# Patient Record
Sex: Female | Born: 1990
Health system: Southern US, Community
[De-identification: ages and names within clinical notes are randomized; demographics above are authoritative.]

---

## 2010-06-30 ENCOUNTER — Emergency Department (HOSPITAL_COMMUNITY): Admission: EM | Admit: 2010-06-30 | Discharge: 2010-07-01 | Payer: Self-pay | Admitting: Emergency Medicine

## 2011-02-10 LAB — DIFFERENTIAL
Basophils Absolute: 0 10*3/uL (ref 0.0–0.1)
Eosinophils Relative: 1 % (ref 0–5)
Lymphocytes Relative: 44 % (ref 12–46)
Lymphs Abs: 2.8 10*3/uL (ref 0.7–4.0)
Monocytes Absolute: 0.4 10*3/uL (ref 0.1–1.0)

## 2011-02-10 LAB — BASIC METABOLIC PANEL
BUN: 12 mg/dL (ref 6–23)
CO2: 23 mEq/L (ref 19–32)
Chloride: 104 mEq/L (ref 96–112)
Potassium: 3.9 mEq/L (ref 3.5–5.1)

## 2011-02-10 LAB — RAPID URINE DRUG SCREEN, HOSP PERFORMED
Amphetamines: NOT DETECTED
Benzodiazepines: POSITIVE — AB

## 2011-02-10 LAB — CBC
HCT: 37 % (ref 36.0–46.0)
MCV: 88.3 fL (ref 78.0–100.0)
RDW: 14.1 % (ref 11.5–15.5)
WBC: 6.5 10*3/uL (ref 4.0–10.5)

## 2011-02-10 LAB — PREGNANCY, URINE: Preg Test, Ur: NEGATIVE

## 2011-02-10 LAB — ETHANOL: Alcohol, Ethyl (B): <5 mg/dL (ref 0–10)

## 2014-05-26 DIAGNOSIS — G43909 Migraine, unspecified, not intractable, without status migrainosus: Secondary | ICD-10-CM | POA: Insufficient documentation

## 2014-05-26 DIAGNOSIS — R41840 Attention and concentration deficit: Secondary | ICD-10-CM | POA: Insufficient documentation

## 2014-08-13 ENCOUNTER — Emergency Department (HOSPITAL_COMMUNITY)
Admission: EM | Admit: 2014-08-13 | Discharge: 2014-08-14 | Disposition: A | Discharge status: Home / Self Care | Payer: 59 | Attending: Emergency Medicine | Admitting: Emergency Medicine

## 2014-08-13 ENCOUNTER — Encounter (HOSPITAL_COMMUNITY): Payer: Self-pay | Admitting: Emergency Medicine

## 2014-08-13 ENCOUNTER — Emergency Department (HOSPITAL_COMMUNITY): Payer: 59

## 2014-08-13 DIAGNOSIS — Y9389 Activity, other specified: Secondary | ICD-10-CM | POA: Insufficient documentation

## 2014-08-13 DIAGNOSIS — Z23 Encounter for immunization: Secondary | ICD-10-CM | POA: Diagnosis not present

## 2014-08-13 DIAGNOSIS — S61209A Unspecified open wound of unspecified finger without damage to nail, initial encounter: Secondary | ICD-10-CM | POA: Insufficient documentation

## 2014-08-13 DIAGNOSIS — Y9289 Other specified places as the place of occurrence of the external cause: Secondary | ICD-10-CM | POA: Insufficient documentation

## 2014-08-13 DIAGNOSIS — IMO0001 Reserved for inherently not codable concepts without codable children: Secondary | ICD-10-CM | POA: Diagnosis not present

## 2014-08-13 DIAGNOSIS — W5501XA Bitten by cat, initial encounter: Secondary | ICD-10-CM

## 2014-08-13 MED ORDER — AMOXICILLIN-POT CLAVULANATE 875-125 MG PO TABS: 1.0000 | ORAL_TABLET | Freq: Once | ORAL | Status: DC

## 2014-08-13 MED ORDER — AMOXICILLIN-POT CLAVULANATE 875-125 MG PO TABS
1.0000 | ORAL_TABLET | Freq: Once | ORAL | Status: AC
  Administered 2014-08-14: 1 via ORAL
  Filled 2014-08-13: qty 1

## 2014-08-13 MED ORDER — ONDANSETRON 4 MG PO TBDP
4.0000 mg | ORAL_TABLET | Freq: Once | ORAL | Status: AC
  Administered 2014-08-14: 4 mg via ORAL
  Filled 2014-08-13: qty 1

## 2014-08-13 NOTE — ED Provider Notes (Signed)
CSN: [id number]     Arrival date & time 08/13/14  2059 History  This chart was scribed for non-physician practitioner working with Hurman Horn, MD, by Roxy Cedar ED Scribe. This patient was seen in room WTR9/WTR9 and the patient's care was started at 11:36 PM   Chief Complaint  Patient presents with  . Animal Bite   The history is provided by the patient. No language interpreter was used.   HPI Comments: Sharon Ewing is a 23 y.o. female who presents to the Emergency Department complaining of an animal bite that occurred earlier today 3.5 hours ago when patient was bit by her pet cat near the thumb of her right hand. Patient states that the cat is up to date on immunizations. Patient states that she has felt constant pain in her right thumb since onset. She states that she felt an intial shooting pain and numbness when her cat bit her but does not feel that anymore. Patient is able to move her thumb. Patient is unsure if she is up to date with her tetanus shot. Patient states that she was in the process of moving out of town and was leaving tonight when cat bite incident occurred.  History reviewed. No pertinent past medical history. History reviewed. No pertinent past surgical history. Family History  Problem Relation Age of Onset  . Migraines Other   . Diabetes Other    History  Substance Use Topics  . Smoking status: Never Smoker   . Smokeless tobacco: Not on file  . Alcohol Use: Yes     Comment: social   OB History   Grav Para Term Preterm Abortions TAB SAB Ect Mult Living                 Review of Systems  Constitutional: Negative for fever and chills.  Gastrointestinal: Negative for vomiting.  Skin: Positive for wound (cat bite).  All other systems reviewed and are negative.  Allergies  Prednisone  Home Medications   Prior to Admission medications   Medication Sig Start Date End Date Taking? Authorizing Provider  amoxicillin-clavulanate (AUGMENTIN) 500-125 MG  per tablet Take 1 tablet (500 mg total) by mouth every 8 (eight) hours. 08/14/14   Chakira Jachim Irine Seal, PA-C  HYDROcodone-acetaminophen (NORCO/VICODIN) 5-325 MG per tablet Take 1-2 tablets by mouth every 4 (four) hours as needed for moderate pain or severe pain. 08/14/14   Dorthula Matas, PA-C   Triage Vitals: BP 114/93  Pulse 74  Temp(Src) 98.3 F (36.8 C) (Oral)  Resp 20  SpO2 100%  LMP 08/11/2014  Physical Exam  Nursing note and vitals reviewed. Constitutional: She appears well-developed and well-nourished.  HENT:  Head: Normocephalic and atraumatic.  Eyes: Conjunctivae are normal. Right eye exhibits no discharge. Left eye exhibits no discharge.  Pulmonary/Chest: Effort normal. No respiratory distress.  Neurological: She is alert. Coordination normal.  Skin: Skin is warm and dry. No rash noted. She is not diaphoretic. No erythema.  Tenderness to right thumb, mild pain with passive ROM. No associated cellulitis. Small amount of ecchymosis. Two puncture wound anterior and one posterior to thenar eminence  Psychiatric: She has a normal mood and affect.   ED Course  Procedures (including critical care time)  DIAGNOSTIC STUDIES: Oxygen Saturation is 100% on RA, normal by my interpretation.    COORDINATION OF CARE: 11:39 PM- Will give patient tetanus shot today. Discussed normal xray results with patient. Explained that cat may have bit into tendon, which is causing increased  amounts of pain to patient. Discussed plans to give patient antibiotics. Advised patient to come back in for recheck if injury becomes more erythematous or firm. Pt advised of plan for treatment and pt agrees.  Labs Review Labs Reviewed - No data to display  Imaging Review Dg Hand Complete Right  08/13/2014   CLINICAL DATA:  Right hand pain following cat bite.  EXAM: RIGHT HAND - COMPLETE 3+ VIEW  COMPARISON:  None.  FINDINGS: There is no evidence of fracture or dislocation. There is no evidence of arthropathy or  other focal bone abnormality. Soft tissues are unremarkable.  No radiopaque foreign bodies are identified.  IMPRESSION: Negative.   Electronically Signed   By: Laveda Abbe M.D.   On: 08/13/2014 22:32    EKG Interpretation None     MDM   Final diagnoses:  Cat bite, initial encounter    Wound cleaned, covered in non stick gauze and wrapped in coban. Tetanus shot and Augmentin given in ED. I was VERY clear that the risk of infection is high regardless of pt having abx, she is on her way to [place] where she is moving. She has been made aware that she needs to be seen right away for red flag symptoms.  amoxicillin-clavulanate (AUGMENTIN) 500-125 MG per tablet Take 1 tablet (500 mg total) by mouth every 8 (eight) hours. 21 tablet Dorthula Matas, PA-C   HYDROcodone-acetaminophen (NORCO/VICODIN) 5-325 MG per tablet Take 1-2 tablets by mouth every 4 (four) hours as needed for moderate pain or severe pain. 12 tablet Dorthula Matas, PA-C   23 y.o.[name] evaluation in the Emergency Department is complete. It has been determined that no acute conditions requiring further emergency intervention are present at this time. The patient/guardian have been advised of the diagnosis and plan. We have discussed signs and symptoms that warrant return to the ED, such as changes or worsening in symptoms.  Vital signs are stable at discharge. Filed Vitals:   08/14/14 0005  BP: 125/58  Pulse: 79  Temp: 98 F (36.7 C)  Resp: 16    Patient/guardian has voiced understanding and agreed to follow-up with the PCP or specialist.   I personally performed the services described in this documentation, which was scribed in my presence. The recorded information has been reviewed and is accurate.  Dorthula Matas, PA-C 08/14/14 0008

## 2014-08-13 NOTE — ED Notes (Signed)
Pt states she was bit by her cat on the right hand   Pt has puncture marks on her anterior and posterior aspect of her thumb  Pt is c/o pain to right hand

## 2014-08-14 MED ORDER — AMOXICILLIN-POT CLAVULANATE 500-125 MG PO TABS: 1.0000 | ORAL_TABLET | Freq: Three times a day (TID) | ORAL | Status: DC

## 2014-08-14 MED ORDER — HYDROCODONE-ACETAMINOPHEN 5-325 MG PO TABS: 1.0000 | ORAL_TABLET | ORAL | Status: DC | PRN

## 2014-08-14 NOTE — Discharge Instructions (Signed)

## 2014-08-14 NOTE — ED Provider Notes (Signed)
Medical screening examination/treatment/procedure(s) were performed by non-physician practitioner and as supervising physician I was immediately available for consultation/collaboration.   EKG Interpretation None       Derwood Kaplan, MD 08/14/14 [phone]

## 2019-12-18 DIAGNOSIS — T781XXD Other adverse food reactions, not elsewhere classified, subsequent encounter: Secondary | ICD-10-CM | POA: Diagnosis not present

## 2019-12-18 DIAGNOSIS — J301 Allergic rhinitis due to pollen: Secondary | ICD-10-CM | POA: Diagnosis not present

## 2019-12-18 DIAGNOSIS — R21 Rash and other nonspecific skin eruption: Secondary | ICD-10-CM | POA: Diagnosis not present

## 2019-12-24 DIAGNOSIS — J3081 Allergic rhinitis due to animal (cat) (dog) hair and dander: Secondary | ICD-10-CM | POA: Diagnosis not present

## 2019-12-24 DIAGNOSIS — J301 Allergic rhinitis due to pollen: Secondary | ICD-10-CM | POA: Diagnosis not present

## 2020-01-01 DIAGNOSIS — J301 Allergic rhinitis due to pollen: Secondary | ICD-10-CM | POA: Diagnosis not present

## 2020-01-01 DIAGNOSIS — J3081 Allergic rhinitis due to animal (cat) (dog) hair and dander: Secondary | ICD-10-CM | POA: Diagnosis not present

## 2020-01-05 DIAGNOSIS — J301 Allergic rhinitis due to pollen: Secondary | ICD-10-CM | POA: Diagnosis not present

## 2020-01-05 DIAGNOSIS — J3081 Allergic rhinitis due to animal (cat) (dog) hair and dander: Secondary | ICD-10-CM | POA: Diagnosis not present

## 2020-01-07 DIAGNOSIS — J3081 Allergic rhinitis due to animal (cat) (dog) hair and dander: Secondary | ICD-10-CM | POA: Diagnosis not present

## 2020-01-07 DIAGNOSIS — J301 Allergic rhinitis due to pollen: Secondary | ICD-10-CM | POA: Diagnosis not present

## 2020-01-21 DIAGNOSIS — J3081 Allergic rhinitis due to animal (cat) (dog) hair and dander: Secondary | ICD-10-CM | POA: Diagnosis not present

## 2020-01-21 DIAGNOSIS — J301 Allergic rhinitis due to pollen: Secondary | ICD-10-CM | POA: Diagnosis not present

## 2020-01-23 DIAGNOSIS — J3081 Allergic rhinitis due to animal (cat) (dog) hair and dander: Secondary | ICD-10-CM | POA: Diagnosis not present

## 2020-01-23 DIAGNOSIS — J301 Allergic rhinitis due to pollen: Secondary | ICD-10-CM | POA: Diagnosis not present

## 2020-01-26 DIAGNOSIS — J3081 Allergic rhinitis due to animal (cat) (dog) hair and dander: Secondary | ICD-10-CM | POA: Diagnosis not present

## 2020-01-26 DIAGNOSIS — J301 Allergic rhinitis due to pollen: Secondary | ICD-10-CM | POA: Diagnosis not present

## 2020-01-30 DIAGNOSIS — J3081 Allergic rhinitis due to animal (cat) (dog) hair and dander: Secondary | ICD-10-CM | POA: Diagnosis not present

## 2020-01-30 DIAGNOSIS — J301 Allergic rhinitis due to pollen: Secondary | ICD-10-CM | POA: Diagnosis not present

## 2020-02-04 DIAGNOSIS — J301 Allergic rhinitis due to pollen: Secondary | ICD-10-CM | POA: Diagnosis not present

## 2020-02-04 DIAGNOSIS — J3081 Allergic rhinitis due to animal (cat) (dog) hair and dander: Secondary | ICD-10-CM | POA: Diagnosis not present

## 2020-02-09 DIAGNOSIS — J3081 Allergic rhinitis due to animal (cat) (dog) hair and dander: Secondary | ICD-10-CM | POA: Diagnosis not present

## 2020-02-09 DIAGNOSIS — J301 Allergic rhinitis due to pollen: Secondary | ICD-10-CM | POA: Diagnosis not present

## 2020-02-12 DIAGNOSIS — J301 Allergic rhinitis due to pollen: Secondary | ICD-10-CM | POA: Diagnosis not present

## 2020-02-12 DIAGNOSIS — J3081 Allergic rhinitis due to animal (cat) (dog) hair and dander: Secondary | ICD-10-CM | POA: Diagnosis not present

## 2020-02-16 DIAGNOSIS — J3089 Other allergic rhinitis: Secondary | ICD-10-CM | POA: Diagnosis not present

## 2020-02-16 DIAGNOSIS — J301 Allergic rhinitis due to pollen: Secondary | ICD-10-CM | POA: Diagnosis not present

## 2020-02-20 DIAGNOSIS — J3081 Allergic rhinitis due to animal (cat) (dog) hair and dander: Secondary | ICD-10-CM | POA: Diagnosis not present

## 2020-02-20 DIAGNOSIS — J301 Allergic rhinitis due to pollen: Secondary | ICD-10-CM | POA: Diagnosis not present

## 2020-02-25 DIAGNOSIS — J3081 Allergic rhinitis due to animal (cat) (dog) hair and dander: Secondary | ICD-10-CM | POA: Diagnosis not present

## 2020-02-25 DIAGNOSIS — J301 Allergic rhinitis due to pollen: Secondary | ICD-10-CM | POA: Diagnosis not present

## 2020-03-03 DIAGNOSIS — J3081 Allergic rhinitis due to animal (cat) (dog) hair and dander: Secondary | ICD-10-CM | POA: Diagnosis not present

## 2020-03-03 DIAGNOSIS — J301 Allergic rhinitis due to pollen: Secondary | ICD-10-CM | POA: Diagnosis not present

## 2020-03-05 DIAGNOSIS — J3081 Allergic rhinitis due to animal (cat) (dog) hair and dander: Secondary | ICD-10-CM | POA: Diagnosis not present

## 2020-03-05 DIAGNOSIS — J301 Allergic rhinitis due to pollen: Secondary | ICD-10-CM | POA: Diagnosis not present

## 2020-03-10 DIAGNOSIS — J301 Allergic rhinitis due to pollen: Secondary | ICD-10-CM | POA: Diagnosis not present

## 2020-03-10 DIAGNOSIS — J3081 Allergic rhinitis due to animal (cat) (dog) hair and dander: Secondary | ICD-10-CM | POA: Diagnosis not present

## 2020-03-17 DIAGNOSIS — J301 Allergic rhinitis due to pollen: Secondary | ICD-10-CM | POA: Diagnosis not present

## 2020-03-17 DIAGNOSIS — J3081 Allergic rhinitis due to animal (cat) (dog) hair and dander: Secondary | ICD-10-CM | POA: Diagnosis not present

## 2020-03-19 DIAGNOSIS — J301 Allergic rhinitis due to pollen: Secondary | ICD-10-CM | POA: Diagnosis not present

## 2020-03-19 DIAGNOSIS — J3081 Allergic rhinitis due to animal (cat) (dog) hair and dander: Secondary | ICD-10-CM | POA: Diagnosis not present

## 2020-03-24 DIAGNOSIS — J301 Allergic rhinitis due to pollen: Secondary | ICD-10-CM | POA: Diagnosis not present

## 2020-03-24 DIAGNOSIS — J3081 Allergic rhinitis due to animal (cat) (dog) hair and dander: Secondary | ICD-10-CM | POA: Diagnosis not present

## 2020-03-31 DIAGNOSIS — J301 Allergic rhinitis due to pollen: Secondary | ICD-10-CM | POA: Diagnosis not present

## 2020-03-31 DIAGNOSIS — J3081 Allergic rhinitis due to animal (cat) (dog) hair and dander: Secondary | ICD-10-CM | POA: Diagnosis not present

## 2020-04-09 DIAGNOSIS — J301 Allergic rhinitis due to pollen: Secondary | ICD-10-CM | POA: Diagnosis not present

## 2020-04-09 DIAGNOSIS — J3081 Allergic rhinitis due to animal (cat) (dog) hair and dander: Secondary | ICD-10-CM | POA: Diagnosis not present

## 2020-04-14 ENCOUNTER — Emergency Department (HOSPITAL_COMMUNITY)
Admission: EM | Admit: 2020-04-14 | Discharge: 2020-04-15 | Disposition: A | Discharge status: Home / Self Care | Payer: Medicare Other | Attending: Emergency Medicine | Admitting: Emergency Medicine

## 2020-04-14 ENCOUNTER — Other Ambulatory Visit: Payer: Self-pay

## 2020-04-14 ENCOUNTER — Emergency Department (HOSPITAL_COMMUNITY): Payer: Medicare Other

## 2020-04-14 ENCOUNTER — Encounter (HOSPITAL_COMMUNITY): Payer: Self-pay

## 2020-04-14 DIAGNOSIS — J029 Acute pharyngitis, unspecified: Secondary | ICD-10-CM | POA: Diagnosis not present

## 2020-04-14 DIAGNOSIS — R509 Fever, unspecified: Secondary | ICD-10-CM | POA: Insufficient documentation

## 2020-04-14 DIAGNOSIS — J302 Other seasonal allergic rhinitis: Secondary | ICD-10-CM | POA: Diagnosis not present

## 2020-04-14 DIAGNOSIS — Z9109 Other allergy status, other than to drugs and biological substances: Secondary | ICD-10-CM | POA: Diagnosis not present

## 2020-04-14 DIAGNOSIS — R079 Chest pain, unspecified: Secondary | ICD-10-CM | POA: Diagnosis not present

## 2020-04-14 DIAGNOSIS — R0602 Shortness of breath: Secondary | ICD-10-CM | POA: Diagnosis not present

## 2020-04-14 DIAGNOSIS — Z79899 Other long term (current) drug therapy: Secondary | ICD-10-CM | POA: Insufficient documentation

## 2020-04-14 DIAGNOSIS — R0981 Nasal congestion: Secondary | ICD-10-CM | POA: Diagnosis present

## 2020-04-14 MED ORDER — POLYETHYLENE GLYCOL 3350 17 G PO PACK: 17.0000 g | PACK | Freq: Once | ORAL | Status: DC

## 2020-04-14 NOTE — ED Triage Notes (Addendum)
Pt sts shob worse on inspiration and left sided chest pain. Sts fevers at home around 100.4 at the highest.

## 2020-04-14 NOTE — ED Notes (Signed)
ED Provider at bedside. 

## 2020-04-14 NOTE — ED Provider Notes (Signed)
[facility] DEPT Provider Note   CSN: [id number] Arrival date & time: 04/14/20  2000     History Chief Complaint  Patient presents with  . Shortness of Breath    Sharon Ewing is a 29 y.o. female.  HPI She presents for evaluation of nasal congestion, sore throat and low-grade fever, present for 2 days.  She has not lost her sensation of taste and smell.  She had a Covid infection in March 2020.  She is not currently employed.  She states she has Marfan's disease but has not had preventive evaluation for it yet.  She denies nausea, vomiting, cough, chest pain, weakness or dizziness.  She takes allergy shots every week, for environmental allergies.  She has moved to [facility].  Previously, she lived in [place].  No known sick contacts.  She is not currently employed but states that she is on disability because of immune deficiency.    History reviewed. No pertinent past medical history.  There are no problems to display for this patient.   History reviewed. No pertinent surgical history.   OB History   No obstetric history on file.     Family History  Problem Relation Age of Onset  . Migraines Other   . Diabetes Other     Social History   Tobacco Use  . Smoking status: Never Smoker  . Smokeless tobacco: Never Used  Substance Use Topics  . Alcohol use: Yes    Comment: social  . Drug use: No    Home Medications Prior to Admission medications   Medication Sig Start Date End Date Taking? Authorizing Provider  amphetamine-dextroamphetamine (ADDERALL) 20 MG tablet Take 40 mg by mouth daily. 03/15/20  Yes [provider]  fluvoxaMINE (LUVOX) 100 MG tablet Take 100 mg by mouth daily. 02/16/20  Yes [provider]  gabapentin (NEURONTIN) 300 MG capsule Take 300 mg by mouth 3 (three) times daily. 03/15/20  Yes [provider]  hydrOXYzine (ATARAX/VISTARIL) 10 MG tablet Take 10 mg by mouth 3 (three) times daily  as needed for anxiety or sleep. 03/15/20  Yes [provider]  levocetirizine (XYZAL) 5 MG tablet Take 1 tablet by mouth every evening. 12/18/19  Yes [provider]  propranolol (INDERAL) 10 MG tablet Take 10 mg by mouth 2 (two) times daily as needed for heart rate control. 03/15/20  Yes [provider]  testosterone cypionate (DEPOTESTOTERONE CYPIONATE) 100 MG/ML injection Inject 0.25 mLs into the muscle once a week. 02/27/20  Yes [provider]    Allergies    Penicillins and Prednisone  Review of Systems   Review of Systems  All other systems reviewed and are negative.   Physical Exam Updated Vital Signs BP 110/68 (BP Location: Left Arm)   Pulse 96   Temp 98.5 F (36.9 C) (Oral)   Resp 17   Ht 6' (1.829 m)   Wt 63.5 kg   SpO2 100%   BMI 18.99 kg/m   Physical Exam Vitals and nursing note reviewed.  Constitutional:      General: She is not in acute distress.    Appearance: She is well-developed. She is not ill-appearing, toxic-appearing or diaphoretic.  HENT:     Head: Normocephalic and atraumatic.  Eyes:     Conjunctiva/sclera: Conjunctivae normal.     Pupils: Pupils are equal, round, and reactive to light.  Neck:     Trachea: Phonation normal.  Cardiovascular:     Rate and Rhythm: Normal  rate and regular rhythm.  Pulmonary:     Effort: Pulmonary effort is normal. No respiratory distress.     Breath sounds: Normal breath sounds. No stridor.  Chest:     Chest wall: No tenderness.  Abdominal:     General: There is no distension.     Palpations: Abdomen is soft.     Tenderness: There is no abdominal tenderness. There is no guarding.  Musculoskeletal:        General: Normal range of motion.     Cervical back: Normal range of motion and neck supple.  Skin:    General: Skin is warm and dry.  Neurological:     Mental Status: She is alert and oriented to person, place, and time.     Motor: No abnormal muscle tone.  Psychiatric:          Mood and Affect: Mood normal.        Behavior: Behavior normal.        Thought Content: Thought content normal.        Judgment: Judgment normal.     ED Results / Procedures / Treatments   Labs (all labs ordered are listed, but only abnormal results are displayed) Labs Reviewed  COMPREHENSIVE METABOLIC PANEL - Abnormal; Notable for the following components:      Result Value   Alkaline Phosphatase 34 (*)    All other components within normal limits  CBC WITH DIFFERENTIAL/PLATELET    EKG EKG Interpretation  Date/Time:  Wednesday Apr 14 2020 20:32:58 EDT Ventricular Rate:  61 PR Interval:    QRS Duration: 83 QT Interval:  397 QTC Calculation: 400 R Axis:   83 Text Interpretation: Sinus rhythm RSR' in V1 or V2, probably normal variant Nonspecific T abnrm, anterolateral leads ST elev, probable normal early repol pattern No old tracing to compare Confirmed by Mancel Bale [phone]) on 04/14/2020 11:58:54 PM   Radiology DG Chest 2 View  Result Date: 04/14/2020 CLINICAL DATA:  29 year old female with shortness of breath and left side chest pain. Low-grade fever. EXAM: CHEST - 2 VIEW COMPARISON:  Report of [facility] chest radiographs 01/06/2014 (no images available). FINDINGS: Pectus excavatum. Normal lung volumes and mediastinal contours. Visualized tracheal air column is within normal limits. Both lungs appear clear. No pneumothorax or pleural effusion. No osseous abnormality identified. Negative visible bowel gas pattern. IMPRESSION: Negative.  No cardiopulmonary abnormality. Electronically Signed   By: Odessa Fleming M.D.   On: 04/14/2020 20:33    Procedures Procedures (including critical care time)  Medications Ordered in ED Medications - No data to display  ED Course  I have reviewed the triage vital signs and the nursing notes.  Pertinent labs & imaging results that were available during my care of the patient were reviewed by me  and considered in my medical decision making (see chart for details).  Clinical Course as of Apr 15 54  Thu Apr 15, 2020  0005 No infiltrate or CHF, or abnormal cardiac/aortic silhouette.  Interpreted by me  DG Chest 2 View [EW]  0050 Normal  Comprehensive metabolic panel(!) [EW]  0050 Normal  CBC with Differential [EW]    Clinical Course User Index [EW] Mancel Bale, MD   MDM Rules/Calculators/A&P                       Patient Vitals for the past 24 hrs:  BP Temp Temp src Pulse Resp SpO2 Height Weight  04/14/20 2329 110/68 98.5 F (36.9 C) Oral 96 17 100 % 6' (1.829 m) 63.5 kg  04/14/20 2011 -- -- -- -- -- -- 6' (1.829 m) 63.5 kg  04/14/20 2010 109/73 98.4 F (36.9 C) Oral (!) 58 17 100 % -- --    12:54 AM Reevaluation with update and discussion. After initial assessment and treatment, an updated evaluation reveals no change in status, findings discussed with patient and femalewith her, all questions answered. Mancel Bale   Medical Decision Making:  This patient is presenting for evaluation of nasal congestion and low-grade fever, which does require a range of treatment options, and is a complaint that involves a moderate risk of morbidity and mortality. The differential diagnoses include sinusitis, environmental allergies, URI. I decided to review old records, and in summary young female, with history of Marfan's.  I did not require additional historical information from anyone.  Clinical Laboratory Tests Ordered, included CBC and Metabolic panel. Review indicates his tests are normal. Radiologic Tests Ordered, included chest x-ray.  I independently Visualized: Radiographic images, which show no CHF or infiltrate  Cardiac Monitor Tracing which shows normal sinus rhythm   Critical Interventions-clinical evaluation, laboratory testing, chest x-ray, observation reassessment  After These Interventions, the Patient was reevaluated and was found to be stable.  Evaluation  consistent with Normal/seasonal allergies.  Doubt bacterial sinusitis, metabolic instability or impending vascular collapse.  CRITICAL CARE-no Performed by: Mancel Bale  Nursing Notes Reviewed/ Care Coordinated Applicable Imaging Reviewed Interpretation of Laboratory Data incorporated into ED treatment  The patient appears reasonably screened and/or stabilized for discharge and I doubt any other medical condition or other [facility] requiring further screening, evaluation, or treatment in the ED at this time prior to discharge.  Plan: Home Medications-continue usual medications; Home Treatments-rest, fluids; return here if the recommended treatment, does not improve the symptoms; Recommended follow up-PCP, as needed     Final Clinical Impression(s) / ED Diagnoses Final diagnoses:  Environmental allergies    Rx / DC Orders ED Discharge Orders    None       Mancel Bale, MD 04/15/20 [phone]

## 2020-04-15 LAB — CBC WITH DIFFERENTIAL/PLATELET
Abs Immature Granulocytes: 0.03 10*3/uL (ref 0.00–0.07)
Basophils Absolute: 0.1 10*3/uL (ref 0.0–0.1)
Basophils Relative: 1 %
Eosinophils Absolute: 0.2 10*3/uL (ref 0.0–0.5)
Eosinophils Relative: 2 %
HCT: 37.4 % (ref 36.0–46.0)
Hemoglobin: 12.8 g/dL (ref 12.0–15.0)
Immature Granulocytes: 0 %
Lymphocytes Relative: 28 %
Lymphs Abs: 2.1 10*3/uL (ref 0.7–4.0)
MCH: 32 pg (ref 26.0–34.0)
MCHC: 34.2 g/dL (ref 30.0–36.0)
MCV: 93.5 fL (ref 80.0–100.0)
Monocytes Absolute: 0.4 10*3/uL (ref 0.1–1.0)
Monocytes Relative: 5 %
Neutro Abs: 5 10*3/uL (ref 1.7–7.7)
Neutrophils Relative %: 64 %
Platelets: 201 10*3/uL (ref 150–400)
RBC: 4 MIL/uL (ref 3.87–5.11)
RDW: 12.5 % (ref 11.5–15.5)
WBC: 7.8 10*3/uL (ref 4.0–10.5)
nRBC: 0 % (ref 0.0–0.2)

## 2020-04-15 LAB — COMPREHENSIVE METABOLIC PANEL
ALT: 8 U/L (ref 0–44)
AST: 15 U/L (ref 15–41)
Albumin: 4.8 g/dL (ref 3.5–5.0)
Alkaline Phosphatase: 34 U/L — ABNORMAL LOW (ref 38–126)
Anion gap: 7 (ref 5–15)
BUN: 13 mg/dL (ref 6–20)
CO2: 26 mmol/L (ref 22–32)
Calcium: 9.6 mg/dL (ref 8.9–10.3)
Chloride: 106 mmol/L (ref 98–111)
Creatinine, Ser: 0.96 mg/dL (ref 0.44–1.00)
GFR calc Af Amer: >60 mL/min (ref >60)
GFR calc non Af Amer: >60 mL/min (ref >60)
Glucose, Bld: 81 mg/dL (ref 70–99)
Potassium: 4 mmol/L (ref 3.5–5.1)
Sodium: 139 mmol/L (ref 135–145)
Total Bilirubin: 1 mg/dL (ref 0.3–1.2)
Total Protein: 7.4 g/dL (ref 6.5–8.1)

## 2020-04-15 NOTE — Discharge Instructions (Addendum)
Try using Flonase nasal spray, both nostrils, twice a day for a couple weeks.  See the doctor of your choice if needed for problems.  Use Tylenol if needed for fever or pain.

## 2020-04-16 DIAGNOSIS — R112 Nausea with vomiting, unspecified: Secondary | ICD-10-CM | POA: Diagnosis not present

## 2020-04-16 DIAGNOSIS — J029 Acute pharyngitis, unspecified: Secondary | ICD-10-CM | POA: Diagnosis not present

## 2020-11-18 ENCOUNTER — Ambulatory Visit (INDEPENDENT_AMBULATORY_CARE_PROVIDER_SITE_OTHER): Payer: Medicare Other | Admitting: Family Medicine

## 2020-11-18 ENCOUNTER — Other Ambulatory Visit: Payer: Self-pay

## 2020-11-18 ENCOUNTER — Encounter: Payer: Self-pay | Admitting: Family Medicine

## 2020-11-18 VITALS — BP 88/60 | HR 84 | Temp 98.5°F | Ht 72.0 in | Wt 137.4 lb

## 2020-11-18 DIAGNOSIS — R636 Underweight: Secondary | ICD-10-CM

## 2020-11-18 DIAGNOSIS — F3281 Premenstrual dysphoric disorder: Secondary | ICD-10-CM | POA: Insufficient documentation

## 2020-11-18 DIAGNOSIS — Z1159 Encounter for screening for other viral diseases: Secondary | ICD-10-CM | POA: Diagnosis not present

## 2020-11-18 DIAGNOSIS — F845 Asperger's syndrome: Secondary | ICD-10-CM | POA: Insufficient documentation

## 2020-11-18 DIAGNOSIS — Z114 Encounter for screening for human immunodeficiency virus [HIV]: Secondary | ICD-10-CM

## 2020-11-18 DIAGNOSIS — K219 Gastro-esophageal reflux disease without esophagitis: Secondary | ICD-10-CM | POA: Insufficient documentation

## 2020-11-18 DIAGNOSIS — Z8659 Personal history of other mental and behavioral disorders: Secondary | ICD-10-CM | POA: Insufficient documentation

## 2020-11-18 DIAGNOSIS — F429 Obsessive-compulsive disorder, unspecified: Secondary | ICD-10-CM | POA: Insufficient documentation

## 2020-11-18 DIAGNOSIS — F428 Other obsessive-compulsive disorder: Secondary | ICD-10-CM

## 2020-11-18 DIAGNOSIS — Z0000 Encounter for general adult medical examination without abnormal findings: Secondary | ICD-10-CM | POA: Diagnosis not present

## 2020-11-18 MED ORDER — OMEPRAZOLE 20 MG PO CPDR: 20.0000 mg | DELAYED_RELEASE_CAPSULE | Freq: Every day | ORAL | 3 refills | Status: AC

## 2020-11-18 NOTE — Progress Notes (Signed)
Patient: Sharon Ewing MRN: [id number] DOB: 1991/08/21 PCP: Orma Flaming, MD     Subjective:  Chief Complaint  Patient presents with  . Annual Exam    HPI: The patient is a 29 y.o. female who presents today for annual exam. She denies any changes to past medical history. There have been no recent hospitalizations. They are not following a well balanced diet and exercise plan. Weight has been stable. She would like to discuss diet options.   She has no family history of breast or colon cancer in her mother. She has 2 brothers that have mental illness. Drugs.  She is estranged from her father and 3 sisters.   She has history of eating disorder that started as a child. She had mixed anorexia and bullemia. She was never hospitalized for this. Misdiagnosed as gender identity disorder around puberty, but she had more body dismorphic disorder. She was put on testoterone, but came off of this. She struggled to communicate her exact issues. She is a lesbian and is married.   PMDD She does have decently normal periods. She is focusing on regulating her nervous system. Took herself off medication. She does go for walks.   Stomach issues/GERD She states she has known GERD. Due to her bulemia, her LES no longer works well. Her endoscopy was done 2018 in [facility].  If she takes her omeprazole she is able to keep food down and eat a little bit. She also states she has to eat very small meals. GI also told her to stay away from sugar. No bowel issues. Pain is in her epigastric area with food only. She is constantly nauseated and vomiting only bad if doesn't take medication or eats large meals. She has been out of her medication and would like a refill as well as referral to nutrition.   Immunization History  Administered Date(s) Administered  . DTaP 04/11/1991, 06/19/1991, 08/07/1991, 08/05/1992, 08/05/1996  . Hepatitis B, ped/adol 03/03/1991, 06/19/1991, 02/09/1992  . HiB (PRP-OMP) 04/11/1991,  06/19/1991, 08/07/1991, 07/19/1995  . IPV 04/11/1991, 06/19/1991, 08/05/1992, 08/05/1996  . MMR 08/04/1992, 07/19/1995  . Td 08/22/2002    Colonoscopy: routine screening  Mammogram: routine screening at 40  Tdap: 2016.    Review of Systems  Respiratory: Negative for chest tightness and shortness of breath.   Genitourinary: Negative for flank pain and urgency.  Neurological: Positive for headaches. Negative for dizziness.    Allergies Patient is allergic to penicillins and prednisone.  Past Medical History Patient  has no past medical history on file.  Surgical History Patient  has no past surgical history on file.  Family History Pateint's family history includes Diabetes in an other family member; Migraines in an other family member.  Social History Patient  reports that she has never smoked. She has never used smokeless tobacco. She reports current alcohol use. She reports that she does not use drugs.    Objective: Vitals:   11/18/20 1334  BP: (!) 88/60  Pulse: 84  Temp: 98.5 F (36.9 C)  TempSrc: Temporal  SpO2: 100%  Weight: 137 lb 6.4 oz (62.3 kg)  Height: 6' (1.829 m)    Body mass index is 18.63 kg/m.  Physical Exam Vitals reviewed.  Constitutional:      Appearance: Normal appearance. She is well-developed and well-nourished.     Comments: thin  HENT:     Head: Normocephalic and atraumatic.     Right Ear: Tympanic membrane, ear canal and external ear normal.  Left Ear: Tympanic membrane, ear canal and external ear normal.     Mouth/Throat:     Mouth: Oropharynx is clear and moist. Mucous membranes are moist.  Eyes:     Extraocular Movements: Extraocular movements intact and EOM normal.     Conjunctiva/sclera: Conjunctivae normal.     Pupils: Pupils are equal, round, and reactive to light.  Neck:     Thyroid: No thyromegaly.  Cardiovascular:     Rate and Rhythm: Normal rate and regular rhythm.     Pulses: Intact distal pulses.     Heart  sounds: Normal heart sounds. No murmur heard.   Pulmonary:     Effort: Pulmonary effort is normal.     Breath sounds: Normal breath sounds.  Abdominal:     General: Abdomen is flat. Bowel sounds are normal. There is no distension.     Palpations: Abdomen is soft.     Tenderness: There is no abdominal tenderness.  Musculoskeletal:     Cervical back: Normal range of motion and neck supple.  Lymphadenopathy:     Cervical: No cervical adenopathy.  Skin:    General: Skin is warm and dry.     Capillary Refill: Capillary refill takes less than 2 seconds.     Findings: No rash.  Neurological:     General: No focal deficit present.     Mental Status: She is alert and oriented to person, place, and time.     Cranial Nerves: No cranial nerve deficit.     Coordination: Coordination normal.     Deep Tendon Reflexes: Reflexes normal.  Psychiatric:        Mood and Affect: Mood and affect and mood normal.        Behavior: Behavior normal.     Comments: No si/hi        [place] Office Visit from 11/18/2020 in [place]  PHQ-2 Total Score 0      Assessment/plan: 1. Annual physical exam Hm reviewed with her today. Will have her come back for pap smear at some point even though in marriage with female, still has risk of HPV. Asked that she look for her records for tdap as well. Routine labs today. F/u in one year or as needed.  Patient counseling [x]    Nutrition: Stressed importance of moderation in sodium/caffeine intake, saturated fat and cholesterol, caloric balance, sufficient intake of fresh fruits, vegetables, fiber, calcium, iron, and 1 mg of folate supplement per day (for females capable of pregnancy).  [x]    Stressed the importance of regular exercise.   []    Substance Abuse: Discussed cessation/primary prevention of tobacco, alcohol, or other drug use; driving or other dangerous activities under the influence; availability of treatment for abuse.   [x]     Injury prevention: Discussed safety belts, safety helmets, smoke detector, smoking near bedding or upholstery.   [x]    Sexuality: Discussed sexually transmitted diseases, partner selection, use of condoms, avoidance of unintended pregnancy  and contraceptive alternatives.  [x]    Dental health: Discussed importance of regular tooth brushing, flossing, and dental visits.  [x]    Health maintenance and immunizations reviewed. Please refer to Health maintenance section.    - CBC with Differential/Platelet; Future - Lipid panel; Future - TSH; Future - COMPLETE METABOLIC PANEL WITH GFR; Future - COMPLETE METABOLIC PANEL WITH GFR - TSH - Lipid panel - CBC with Differential/Platelet  2. Encounter for screening for HIV  - HIV Antibody (routine testing w rflx)  3. Encounter  for hepatitis C screening test for low risk patient  - Hepatitis C antibody  4. PMDD (premenstrual dysphoric disorder) Complicated history. Can not tolerate OCP, doing well off all medication and feels like she is coping well. Will continue to monitor but asked her to reach out if feels like not coping as well.   5. Underweight due to inadequate caloric intake  - Referral to Nutrition and Diabetes Services - Prealbumin; Future - Prealbumin  6. GERD -refilled low dose PPI -may need to see GI at some point if LES is truly not tight as she says. ? If good candidate for TIPS.  -let me know if not controlled don medication.   7. Other obsessive-compulsive disorders Did well on luvox, but is off medication and feels like coping well. Tied to eating disorder past. First time meeting her and she says she is doing well, will have her fu in 6 months to make sure coping okay.    -requested records.   This visit occurred during the SARS-CoV-2 public health emergency.  Safety protocols were in place, including screening questions prior to the visit, additional usage of staff PPE, and extensive cleaning of exam room while  observing appropriate contact time as indicated for disinfecting solutions.     Return in about 6 months (around 05/19/2021) for pap/f/u .     Orma Flaming, MD [place]  11/18/2020

## 2020-11-18 NOTE — Patient Instructions (Addendum)
1) referral to nutrition done, please let me know if you do not hear from them.  2) sent in your medication low dose so let me know if we need to increase this.  3) labs today.   -reflux getting worse I would suggest we send to GI.  Preventive Care 38-29 Years Old, Female Preventive care refers to visits with your health care provider and lifestyle choices that can promote health and wellness. This includes:  A yearly physical exam. This may also be called an annual well check.  Regular dental visits and eye exams.  Immunizations.  Screening for certain conditions.  Healthy lifestyle choices, such as eating a healthy diet, getting regular exercise, not using drugs or products that contain nicotine and tobacco, and limiting alcohol use. What can I expect for my preventive care visit? Physical exam Your health care provider will check your:  Height and weight. This may be used to calculate body mass index (BMI), which tells if you are at a healthy weight.  Heart rate and blood pressure.  Skin for abnormal spots. Counseling Your health care provider may ask you questions about your:  Alcohol, tobacco, and drug use.  Emotional well-being.  Home and relationship well-being.  Sexual activity.  Eating habits.  Work and work Statistician.  Method of birth control.  Menstrual cycle.  Pregnancy history. What immunizations do I need?  Influenza (flu) vaccine  This is recommended every year. Tetanus, diphtheria, and pertussis (Tdap) vaccine  You may need a Td booster every 10 years. Varicella (chickenpox) vaccine  You may need this if you have not been vaccinated. Human papillomavirus (HPV) vaccine  If recommended by your health care provider, you may need three doses over 6 months. Measles, mumps, and rubella (MMR) vaccine  You may need at least one dose of MMR. You may also need a second dose. Meningococcal conjugate (MenACWY) vaccine  One dose is recommended if  you are age 23-21 years and a first-year college student living in a residence hall, or if you have one of several medical conditions. You may also need additional booster doses. Pneumococcal conjugate (PCV13) vaccine  You may need this if you have certain conditions and were not previously vaccinated. Pneumococcal polysaccharide (PPSV23) vaccine  You may need one or two doses if you smoke cigarettes or if you have certain conditions. Hepatitis A vaccine  You may need this if you have certain conditions or if you travel or work in places where you may be exposed to hepatitis A. Hepatitis B vaccine  You may need this if you have certain conditions or if you travel or work in places where you may be exposed to hepatitis B. Haemophilus influenzae type b (Hib) vaccine  You may need this if you have certain conditions. You may receive vaccines as individual doses or as more than one vaccine together in one shot (combination vaccines). Talk with your health care provider about the risks and benefits of combination vaccines. What tests do I need?  Blood tests  Lipid and cholesterol levels. These may be checked every 5 years starting at age 25.  Hepatitis C test.  Hepatitis B test. Screening  Diabetes screening. This is done by checking your blood sugar (glucose) after you have not eaten for a while (fasting).  Sexually transmitted disease (STD) testing.  BRCA-related cancer screening. This may be done if you have a family history of breast, ovarian, tubal, or peritoneal cancers.  Pelvic exam and Pap test. This may be  done every 3 years starting at age 39. Starting at age 100, this may be done every 5 years if you have a Pap test in combination with an HPV test. Talk with your health care provider about your test results, treatment options, and if necessary, the need for more tests. Follow these instructions at home: Eating and drinking   Eat a diet that includes fresh fruits and  vegetables, whole grains, lean protein, and low-fat dairy.  Take vitamin and mineral supplements as recommended by your health care provider.  Do not drink alcohol if: ? Your health care provider tells you not to drink. ? You are pregnant, may be pregnant, or are planning to become pregnant.  If you drink alcohol: ? Limit how much you have to 0-1 drink a day. ? Be aware of how much alcohol is in your drink. In the [place] one drink equals one 12 oz bottle of beer (355 mL), one 5 oz glass of wine (148 mL), or one 1 oz glass of hard liquor (44 mL). Lifestyle  Take daily care of your teeth and gums.  Stay active. Exercise for at least 30 minutes on 5 or more days each week.  Do not use any products that contain nicotine or tobacco, such as cigarettes, e-cigarettes, and chewing tobacco. If you need help quitting, ask your health care provider.  If you are sexually active, practice safe sex. Use a condom or other form of birth control (contraception) in order to prevent pregnancy and STIs (sexually transmitted infections). If you plan to become pregnant, see your health care provider for a preconception visit. What's next?  Visit your health care provider once a year for a well check visit.  Ask your health care provider how often you should have your eyes and teeth checked.  Stay up to date on all vaccines. This information is not intended to replace advice given to you by your health care provider. Make sure you discuss any questions you have with your health care provider. Document Revised: 07/25/2018 Document Reviewed: 07/25/2018 Elsevier Patient Education  2020 [employer].

## 2020-11-22 ENCOUNTER — Other Ambulatory Visit: Payer: Self-pay | Admitting: Family Medicine

## 2020-11-22 LAB — COMPLETE METABOLIC PANEL WITH GFR
AG Ratio: 2.1 (calc) (ref 1.0–2.5)
ALT: 7 U/L (ref 6–29)
AST: 15 U/L (ref 10–30)
Albumin: 5.2 g/dL — ABNORMAL HIGH (ref 3.6–5.1)
Alkaline phosphatase (APISO): 36 U/L (ref 31–125)
BUN: 11 mg/dL (ref 7–25)
CO2: 23 mmol/L (ref 20–32)
Calcium: 10.4 mg/dL — ABNORMAL HIGH (ref 8.6–10.2)
Chloride: 105 mmol/L (ref 98–110)
Creat: 1 mg/dL (ref 0.50–1.10)
GFR, Est African American: 88 mL/min/{1.73_m2} (ref >=60)
GFR, Est Non African American: 76 mL/min/{1.73_m2} (ref >=60)
Globulin: 2.5 g/dL (calc) (ref 1.9–3.7)
Glucose, Bld: 84 mg/dL (ref 65–99)
Potassium: 4 mmol/L (ref 3.5–5.3)
Sodium: 138 mmol/L (ref 135–146)
Total Bilirubin: 1.4 mg/dL — ABNORMAL HIGH (ref 0.2–1.2)
Total Protein: 7.7 g/dL (ref 6.1–8.1)

## 2020-11-22 LAB — CBC WITH DIFFERENTIAL/PLATELET
Absolute Monocytes: 391 cells/uL (ref 200–950)
Basophils Absolute: 50 cells/uL (ref 0–200)
Basophils Relative: 0.8 %
Eosinophils Absolute: 88 cells/uL (ref 15–500)
Eosinophils Relative: 1.4 %
HCT: 39.3 % (ref 35.0–45.0)
Hemoglobin: 13.5 g/dL (ref 11.7–15.5)
Lymphs Abs: 2457 cells/uL (ref 850–3900)
MCH: 31.8 pg (ref 27.0–33.0)
MCHC: 34.4 g/dL (ref 32.0–36.0)
MCV: 92.5 fL (ref 80.0–100.0)
MPV: 12.4 fL (ref 7.5–12.5)
Monocytes Relative: 6.2 %
Neutro Abs: 3314 cells/uL (ref 1500–7800)
Neutrophils Relative %: 52.6 %
Platelets: 160 10*3/uL (ref 140–400)
RBC: 4.25 10*6/uL (ref 3.80–5.10)
RDW: 12.6 % (ref 11.0–15.0)
Total Lymphocyte: 39 %
WBC: 6.3 10*3/uL (ref 3.8–10.8)

## 2020-11-22 LAB — LIPID PANEL
Cholesterol: 127 mg/dL (ref <200)
HDL: 63 mg/dL (ref >=50)
LDL Cholesterol (Calc): 51 mg/dL (calc)
Non-HDL Cholesterol (Calc): 64 mg/dL (calc) (ref <130)
Total CHOL/HDL Ratio: 2 (calc) (ref <5.0)
Triglycerides: 58 mg/dL (ref <150)

## 2020-11-22 LAB — PREALBUMIN: Prealbumin: 24 mg/dL (ref 17–34)

## 2020-11-22 LAB — TSH: TSH: 1.22 mIU/L

## 2020-11-22 LAB — HEPATITIS C ANTIBODY
Hepatitis C Ab: NONREACTIVE
SIGNAL TO CUT-OFF: 0.01 (ref <1.00)

## 2020-11-22 LAB — HIV ANTIBODY (ROUTINE TESTING W REFLEX): HIV 1&2 Ab, 4th Generation: NONREACTIVE

## 2021-02-04 ENCOUNTER — Telehealth: Payer: Self-pay | Admitting: Family Medicine

## 2021-02-04 NOTE — Telephone Encounter (Signed)
Left message for patient to call back and schedule Medicare Annual Wellness Visit (AWV) either virtually or in office. No detailed message left   AWVI  please schedule at anytime   This should be a 45 minute visit. 

## 2021-07-23 IMAGING — CR DG CHEST 2V
2 series · 2 of 2 positions shown · non-contrast
Comparison: Report of [facility] [REDACTED] chest radiographs 01/06/2014 (no images available).

CLINICAL DATA: 29-year-old female with shortness of breath and left
side chest pain. Low-grade fever.

EXAM:
CHEST - 2 VIEW

[w chest pa]
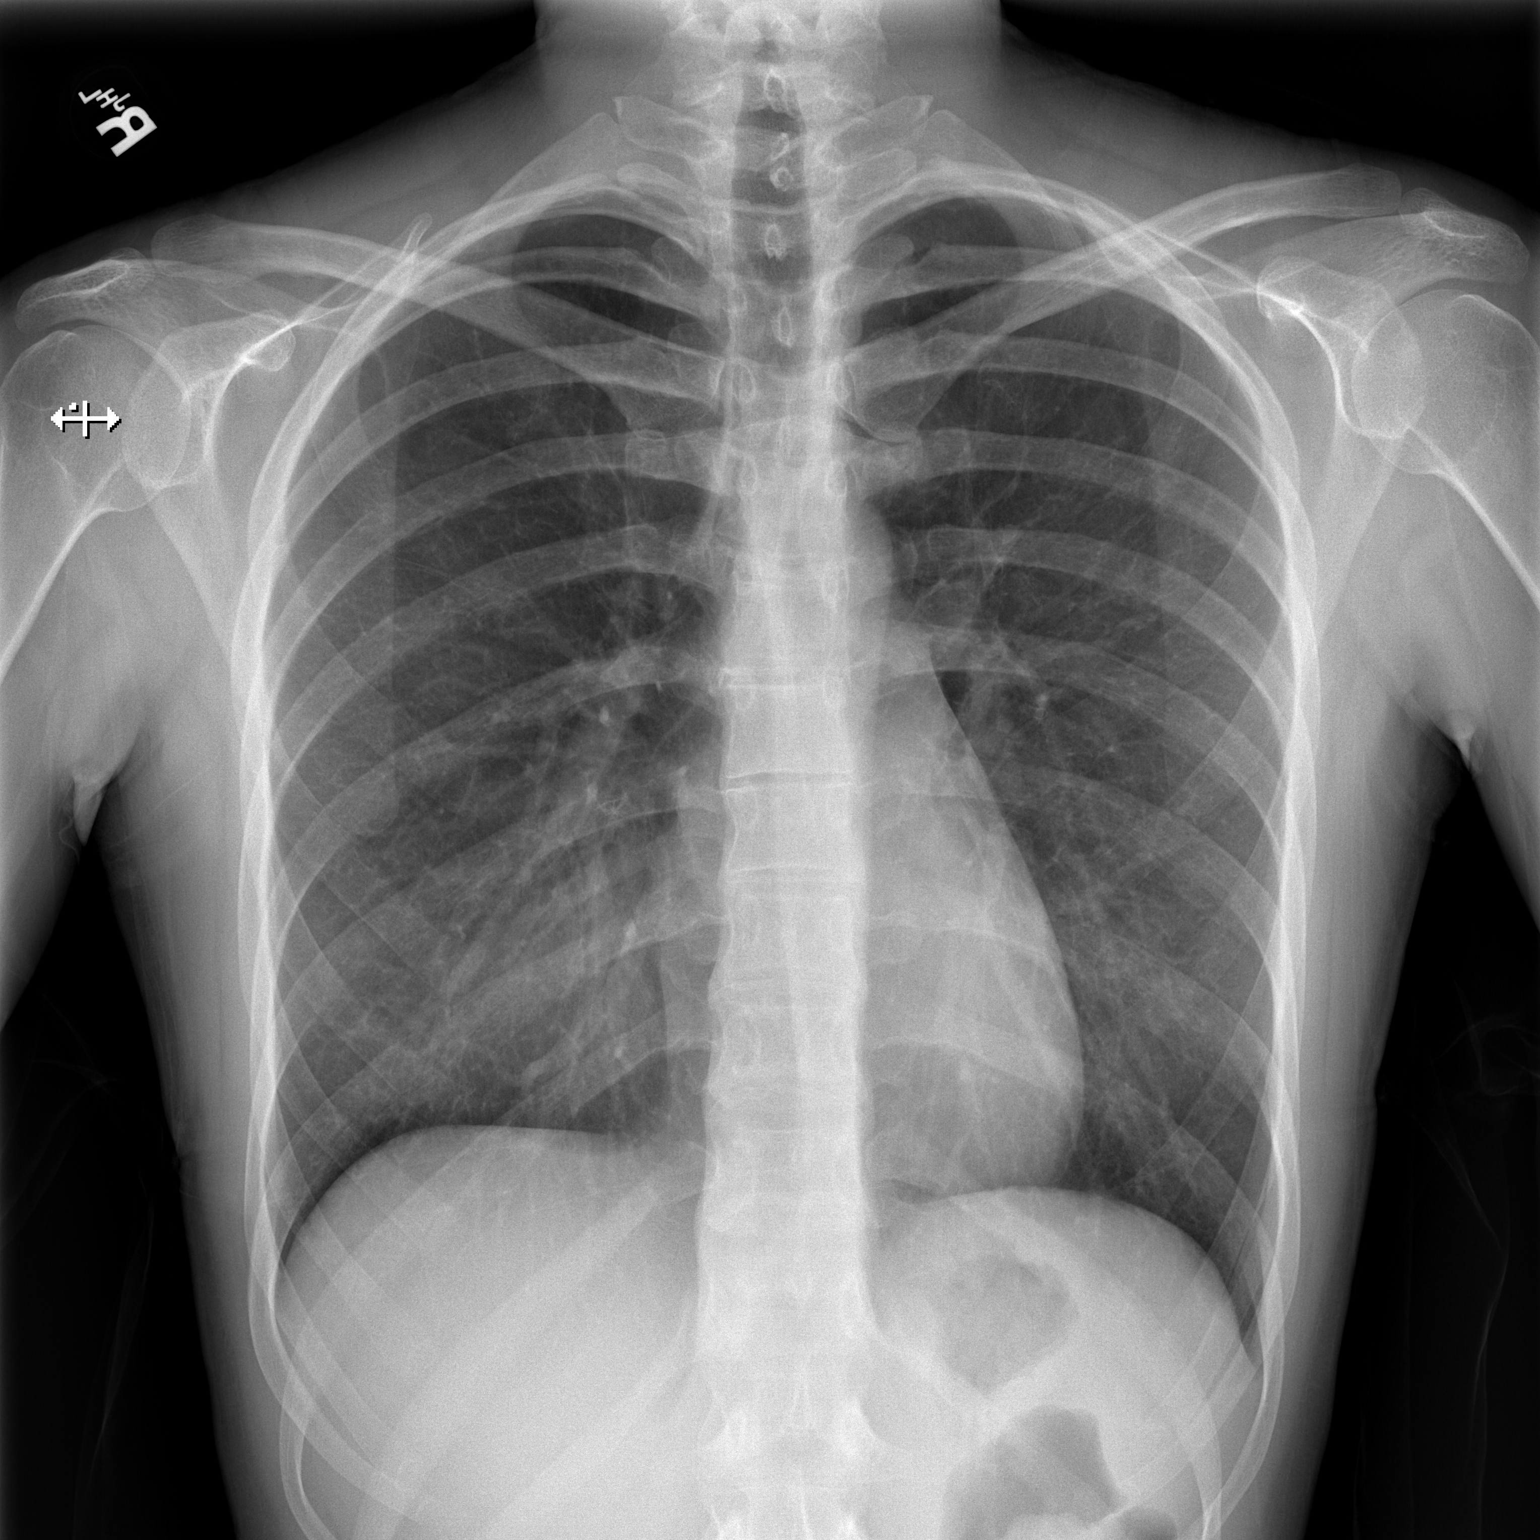

[w chest lat]
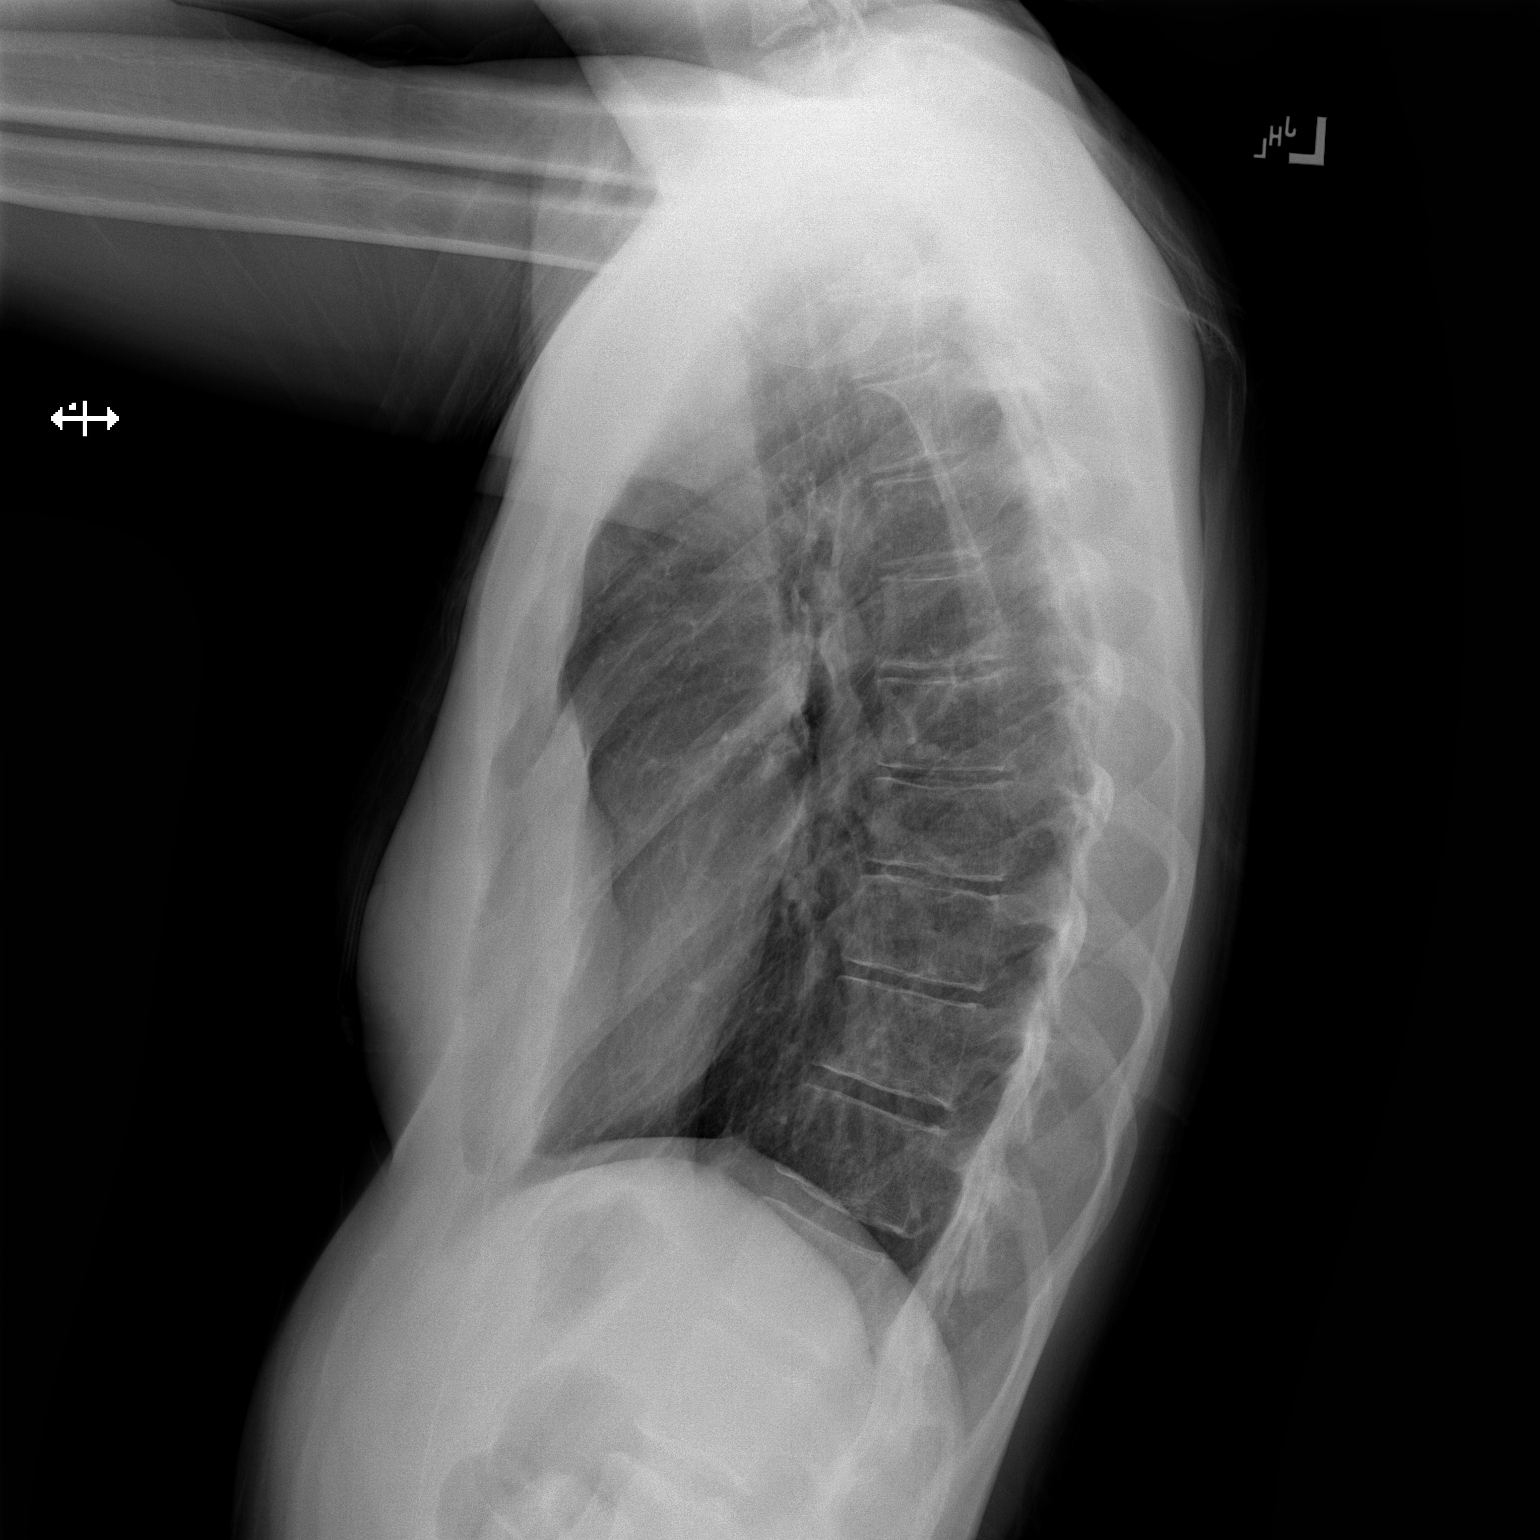

[2 of 2 positions shown; findings below may reference images not displayed]

FINDINGS: Pectus excavatum. Normal lung volumes and mediastinal contours.
Visualized tracheal air column is within normal limits. Both lungs
appear clear. No pneumothorax or pleural effusion. No osseous
abnormality identified. Negative visible bowel gas pattern.
IMPRESSION: Negative.  No cardiopulmonary abnormality.

## 2022-03-30 DIAGNOSIS — T7840XA Allergy, unspecified, initial encounter: Secondary | ICD-10-CM | POA: Diagnosis not present

## 2022-04-03 DIAGNOSIS — Z760 Encounter for issue of repeat prescription: Secondary | ICD-10-CM | POA: Diagnosis not present

## 2022-04-03 DIAGNOSIS — J309 Allergic rhinitis, unspecified: Secondary | ICD-10-CM | POA: Diagnosis not present

## 2022-04-03 DIAGNOSIS — R0600 Dyspnea, unspecified: Secondary | ICD-10-CM | POA: Diagnosis not present

## 2022-04-03 DIAGNOSIS — T782XXA Anaphylactic shock, unspecified, initial encounter: Secondary | ICD-10-CM | POA: Diagnosis not present

## 2022-04-03 DIAGNOSIS — T7840XA Allergy, unspecified, initial encounter: Secondary | ICD-10-CM | POA: Diagnosis not present

## 2022-04-04 ENCOUNTER — Emergency Department (HOSPITAL_COMMUNITY)
Admission: EM | Admit: 2022-04-04 | Discharge: 2022-04-04 | Disposition: A | Discharge status: Home / Self Care | Payer: Medicare Other | Attending: Emergency Medicine | Admitting: Emergency Medicine

## 2022-04-04 ENCOUNTER — Other Ambulatory Visit: Payer: Self-pay

## 2022-04-04 ENCOUNTER — Emergency Department (HOSPITAL_COMMUNITY): Payer: Medicare Other

## 2022-04-04 DIAGNOSIS — J45909 Unspecified asthma, uncomplicated: Secondary | ICD-10-CM | POA: Diagnosis not present

## 2022-04-04 DIAGNOSIS — R0789 Other chest pain: Secondary | ICD-10-CM | POA: Diagnosis not present

## 2022-04-04 DIAGNOSIS — R0602 Shortness of breath: Secondary | ICD-10-CM | POA: Insufficient documentation

## 2022-04-04 LAB — URINALYSIS, ROUTINE W REFLEX MICROSCOPIC
Bilirubin Urine: NEGATIVE
Glucose, UA: NEGATIVE mg/dL
Hgb urine dipstick: NEGATIVE
Ketones, ur: NEGATIVE mg/dL
Leukocytes,Ua: NEGATIVE
Nitrite: NEGATIVE
Protein, ur: NEGATIVE mg/dL
Specific Gravity, Urine: 1.009 (ref 1.005–1.030)
pH: 6 (ref 5.0–8.0)

## 2022-04-04 LAB — COMPREHENSIVE METABOLIC PANEL
ALT: 11 U/L (ref 0–44)
AST: 17 U/L (ref 15–41)
Albumin: 4.8 g/dL (ref 3.5–5.0)
Alkaline Phosphatase: 39 U/L (ref 38–126)
Anion gap: 8 (ref 5–15)
BUN: 13 mg/dL (ref 6–20)
CO2: 24 mmol/L (ref 22–32)
Calcium: 9.7 mg/dL (ref 8.9–10.3)
Chloride: 107 mmol/L (ref 98–111)
Creatinine, Ser: 0.97 mg/dL (ref 0.44–1.00)
GFR, Estimated: >60 mL/min (ref >60)
Glucose, Bld: 95 mg/dL (ref 70–99)
Potassium: 4 mmol/L (ref 3.5–5.1)
Sodium: 139 mmol/L (ref 135–145)
Total Bilirubin: 1.3 mg/dL — ABNORMAL HIGH (ref 0.3–1.2)
Total Protein: 7.3 g/dL (ref 6.5–8.1)

## 2022-04-04 LAB — CBC
HCT: 39.5 % (ref 36.0–46.0)
Hemoglobin: 13.5 g/dL (ref 12.0–15.0)
MCH: 31.8 pg (ref 26.0–34.0)
MCHC: 34.2 g/dL (ref 30.0–36.0)
MCV: 93.2 fL (ref 80.0–100.0)
Platelets: 201 10*3/uL (ref 150–400)
RBC: 4.24 MIL/uL (ref 3.87–5.11)
RDW: 12.8 % (ref 11.5–15.5)
WBC: 10.4 10*3/uL (ref 4.0–10.5)
nRBC: 0 % (ref 0.0–0.2)

## 2022-04-04 LAB — I-STAT BETA HCG BLOOD, ED (MC, WL, AP ONLY): I-stat hCG, quantitative: <5 m[IU]/mL (ref <5)

## 2022-04-04 LAB — LIPASE, BLOOD: Lipase: 32 U/L (ref 11–51)

## 2022-04-04 MED ORDER — IPRATROPIUM BROMIDE 0.02 % IN SOLN: 0.5000 mg | Freq: Once | RESPIRATORY_TRACT | Status: DC

## 2022-04-04 MED ORDER — DIPHENHYDRAMINE HCL 50 MG/ML IJ SOLN: 50.0000 mg | Freq: Once | INTRAMUSCULAR | Status: DC

## 2022-04-04 MED ORDER — DIPHENHYDRAMINE HCL 50 MG/ML IJ SOLN: 25.0000 mg | Freq: Once | INTRAMUSCULAR | Status: DC

## 2022-04-04 MED ORDER — ALBUTEROL SULFATE (2.5 MG/3ML) 0.083% IN NEBU: 2.5000 mg | INHALATION_SOLUTION | Freq: Once | RESPIRATORY_TRACT | Status: DC

## 2022-04-04 MED ORDER — MONTELUKAST SODIUM 10 MG PO TABS: 10.0000 mg | ORAL_TABLET | Freq: Every day | ORAL | 0 refills | Status: DC

## 2022-04-04 MED ORDER — MONTELUKAST SODIUM 10 MG PO TABS: 10.0000 mg | ORAL_TABLET | Freq: Every day | ORAL | 0 refills | Status: AC

## 2022-04-04 MED ORDER — DIPHENHYDRAMINE HCL 25 MG PO CAPS: 25.0000 mg | ORAL_CAPSULE | Freq: Four times a day (QID) | ORAL | 0 refills | Status: AC | PRN

## 2022-04-04 NOTE — Discharge Instructions (Addendum)
Please call the asthma and allergy center that is affiliated with [facility] health for an appointment for further testing. ? ?Please take Singulair - we have prescribed you with a month worth of Singulair. ? ?Take benadryl as needed for 5 days. ? ?Return to the ER if your respiratory symptoms get severe. ? ?

## 2022-04-04 NOTE — ED Provider Notes (Signed)
?[facility] EMERGENCY DEPARTMENT ?Provider Note ? ? ?CSN: [id number] ?Arrival date & time: 04/04/22  1223 ? ?  ? ?History ? ?Chief Complaint  ?Patient presents with  ? Chest Pain  ? Shortness of Breath  ? Asthma  ? ? ?Sharon Ewing is a 31 y.o. female. ? ?HPI ? ?  ?31 year old female comes in with chief complaint of chest pain, shortness of breath and asthma concerns. ? ?Patient indicates that she has been having some respiratory issues for the last few weeks.  She has been diagnosed with anaphylaxis and has received couple of EpiPen's.  2 days ago, she had a televisit and was told that she might have asthma. ? ?Patient normally lives in [place].  She indicates that she likely was sensitive to the paint as there is some construction going on in her apartment building, leading to these abrupt respiratory responses.  In general, she has sensitivity to several things -environmental and food related. ? ?She came home and was outside with her mother, when she suddenly started having shortness of breath again.  This time, mom advised patient to not take EpiPen and come to the ER.  While waiting for to be seen by provider here, she started feeling better and now only has some chest tightness. ? ?Patient is in between insurance, she has [place] and indicates that she is out of network in [place], therefore has been unable to follow-up with a PCP or an allergist over there. ? ?Patient has been frustrated with confusion and ambiguity surrounding her symptoms. ? ?Home Medications ?Prior to Admission medications   ?Medication Sig Start Date End Date Taking? Authorizing Provider  ?diphenhydrAMINE (BENADRYL) 25 mg capsule Take 1 capsule (25 mg total) by mouth every 6 (six) hours as needed for itching. 04/04/22  Yes Derwood Kaplan, MD  ?montelukast (SINGULAIR) 10 MG tablet Take 1 tablet (10 mg total) by mouth at bedtime. 04/04/22   Derwood Kaplan, MD  ?omeprazole (PRILOSEC) 20 MG capsule Take  1 capsule (20 mg total) by mouth daily. 11/18/20   Orland Mustard, MD  ?   ? ?Allergies    ?Penicillins and Prednisone   ? ?Review of Systems   ?Review of Systems ? ?Physical Exam ?Updated Vital Signs ?BP 109/63   Pulse (!) 58   Temp 98 ?F (36.7 ?C)   Resp 20   LMP 03/23/2022   SpO2 100%  ?Physical Exam ?Vitals and nursing note reviewed.  ?Constitutional:   ?   Appearance: She is well-developed.  ?HENT:  ?   Head: Atraumatic.  ?Cardiovascular:  ?   Rate and Rhythm: Normal rate.  ?Pulmonary:  ?   Effort: Pulmonary effort is normal. No tachypnea or respiratory distress.  ?   Breath sounds: Normal breath sounds. No stridor. No wheezing.  ?Musculoskeletal:  ?   Cervical back: Normal range of motion and neck supple.  ?Skin: ?   General: Skin is warm and dry.  ?Neurological:  ?   Mental Status: She is alert and oriented to person, place, and time.  ? ? ?ED Results / Procedures / Treatments   ?Labs ?(all labs ordered are listed, but only abnormal results are displayed) ?Labs Reviewed  ?COMPREHENSIVE METABOLIC PANEL - Abnormal; Notable for the following components:  ?    Result Value  ? Total Bilirubin 1.3 (*)   ? All other components within normal limits  ?LIPASE, BLOOD  ?CBC  ?URINALYSIS, ROUTINE W REFLEX MICROSCOPIC  ?I-STAT BETA HCG BLOOD, ED (  [facility], [facility], [facility] ONLY)  ? ? ?EKG ?None ? ?Radiology ?DG Chest 2 View ? ?Result Date: 04/04/2022 ?CLINICAL DATA:  Asthma shortness of breath EXAM: CHEST - 2 VIEW COMPARISON:  04/14/2020 FINDINGS: The heart size and mediastinal contours are within normal limits. Both lungs are clear. The visualized skeletal structures are unremarkable except for similar pectus excavatum deformity. IMPRESSION: No active cardiopulmonary disease. Electronically Signed   By: Judie Petit.  Shick M.D.   On: 04/04/2022 14:10   ? ?Procedures ?Procedures  ? ? ?Medications Ordered in ED ?Medications  ?albuterol (PROVENTIL) (2.5 MG/3ML) 0.083% nebulizer solution 2.5 mg (has no administration in time range)  ?ipratropium  (ATROVENT) nebulizer solution 0.5 mg (has no administration in time range)  ?diphenhydrAMINE (BENADRYL) injection 50 mg (has no administration in time range)  ? ? ?ED Course/ Medical Decision Making/ A&P ?  ?                        ?Medical Decision Making ?Risk ?Prescription drug management. ? ? ?This patient presents to the ED with chief complaint(s) of shortness of breath with pertinent past medical history of multiple allergies and recent boutS of respiratory response to unknown exposures which further complicates the presenting complaint. ? ?The differential diagnosis includes : Hypersensitive reaction manifesting itself as asthma, bronchospasms.  Anaphylaxis or anaphylactoid response.  ? ?Patient is on Medrol Dosepak, she thinks that it might be contributing.  She only has 2 days remaining of it. ? ?In the past, she has had sensitivity to multiple antihistamines.  At this time, Benadryl has worked okay for her ? ?She had a telehealth visit where the doctor mentioned that she might need to be on albuterol to control her symptoms better, and that albuterol might not be the best course of action for her.  She has never taken albuterol. ? ?Currently, patient does not have any abnormal respiratory findings.  Her O2 sats are 100%.  She is not tachypneic or in any respiratory distress. ? ?Patient agreed to receive IM Benadryl and trying ipratropium and albuterol DuoNeb.  We will prescribe her Singulair.  ?She will be given consultation with [place] allergy. ? ?Additional history obtained: ?Additional history obtained from family ? ? ?Independent labs interpretation:  ?The following labs were independently interpreted: CBC, metabolic profile which are all reassuring ? ?Independent visualization of imaging: ?- I independently visualized the following imaging with scope of interpretation limited to determining acute life threatening conditions related to emergency care: X-ray of the chest, which revealed no evidence  of pneumothorax, focal consolidation or pleural effusion ? ?4:25 PM ?Patient has declined DuoNeb.  She just wants her prescription and go home.  Stable for discharge.  We will give her phone number for follow-up. ? ? ?Final Clinical Impression(s) / ED Diagnoses ?Final diagnoses:  ?Shortness of breath  ?Chest tightness  ? ? ?Rx / DC Orders ?ED Discharge Orders   ? ?      Ordered  ?  montelukast (SINGULAIR) 10 MG tablet  Daily at bedtime,   Status:  Discontinued       ? 04/04/22 1612  ?  diphenhydrAMINE (BENADRYL) 25 mg capsule  Every 6 hours PRN       ? 04/04/22 1612  ?  montelukast (SINGULAIR) 10 MG tablet  Daily at bedtime       ? 04/04/22 1616  ? ?  ?  ? ?  ? ? ?  ?Derwood Kaplan, MD ?04/04/22 1625 ? ?

## 2022-04-04 NOTE — ED Provider Triage Note (Signed)
Emergency Medicine Provider Triage Evaluation Note ? ?Milon Score , a 31 y.o. female  was evaluated in triage.  Pt complains of difficulty breathing states she had an anaphylactic reaction. ? ?Seems a variety of things can cause anaphylaxis.  ? ?Some crampy abd pain  ? ?Review of Systems  ?Positive: SOB, abd pain ?Negative: Fever  ? ?Physical Exam  ?BP (!) 95/57   Pulse 61   Temp 98 ?F (36.7 ?C)   Resp 18   SpO2 100%  ?Gen:   Awake, no distress, somwhat anxious  ?Resp:  Normal effort  ?MSK:   Moves extremities without difficulty  ?Other:  No leg swelling ? ?Medical Decision Making  ?Medically screening exam initiated at 1:29 PM.  Appropriate orders placed.  Thamara Leger was informed that the remainder of the evaluation will be completed by another provider, this initial triage assessment does not replace that evaluation, and the importance of remaining in the ED until their evaluation is complete. ? ?Labs, ekg, CXR ?  ?Gailen Shelter, [place] ?04/04/22 1336 ? ?

## 2022-04-04 NOTE — ED Triage Notes (Signed)
Computer broke unable to sign ?

## 2022-04-04 NOTE — ED Triage Notes (Signed)
Pt. Stated, I was dx with asthma 2 days ago and I probably had an asthma attack . I feel like I cant get my lungs fully expanded. Went to another hospital yesterday in [place]. ?

## 2022-04-19 DIAGNOSIS — R009 Unspecified abnormalities of heart beat: Secondary | ICD-10-CM | POA: Diagnosis not present

## 2022-04-19 DIAGNOSIS — R0602 Shortness of breath: Secondary | ICD-10-CM | POA: Diagnosis not present

## 2022-04-19 DIAGNOSIS — K219 Gastro-esophageal reflux disease without esophagitis: Secondary | ICD-10-CM | POA: Diagnosis not present

## 2022-04-19 DIAGNOSIS — M79605 Pain in left leg: Secondary | ICD-10-CM | POA: Diagnosis not present

## 2023-07-13 IMAGING — DX DG CHEST 2V
2 series · 2 of 2 positions shown · non-contrast
Comparison: 04/14/2020

CLINICAL DATA: Asthma shortness of breath

EXAM:
CHEST - 2 VIEW

[chest pa]
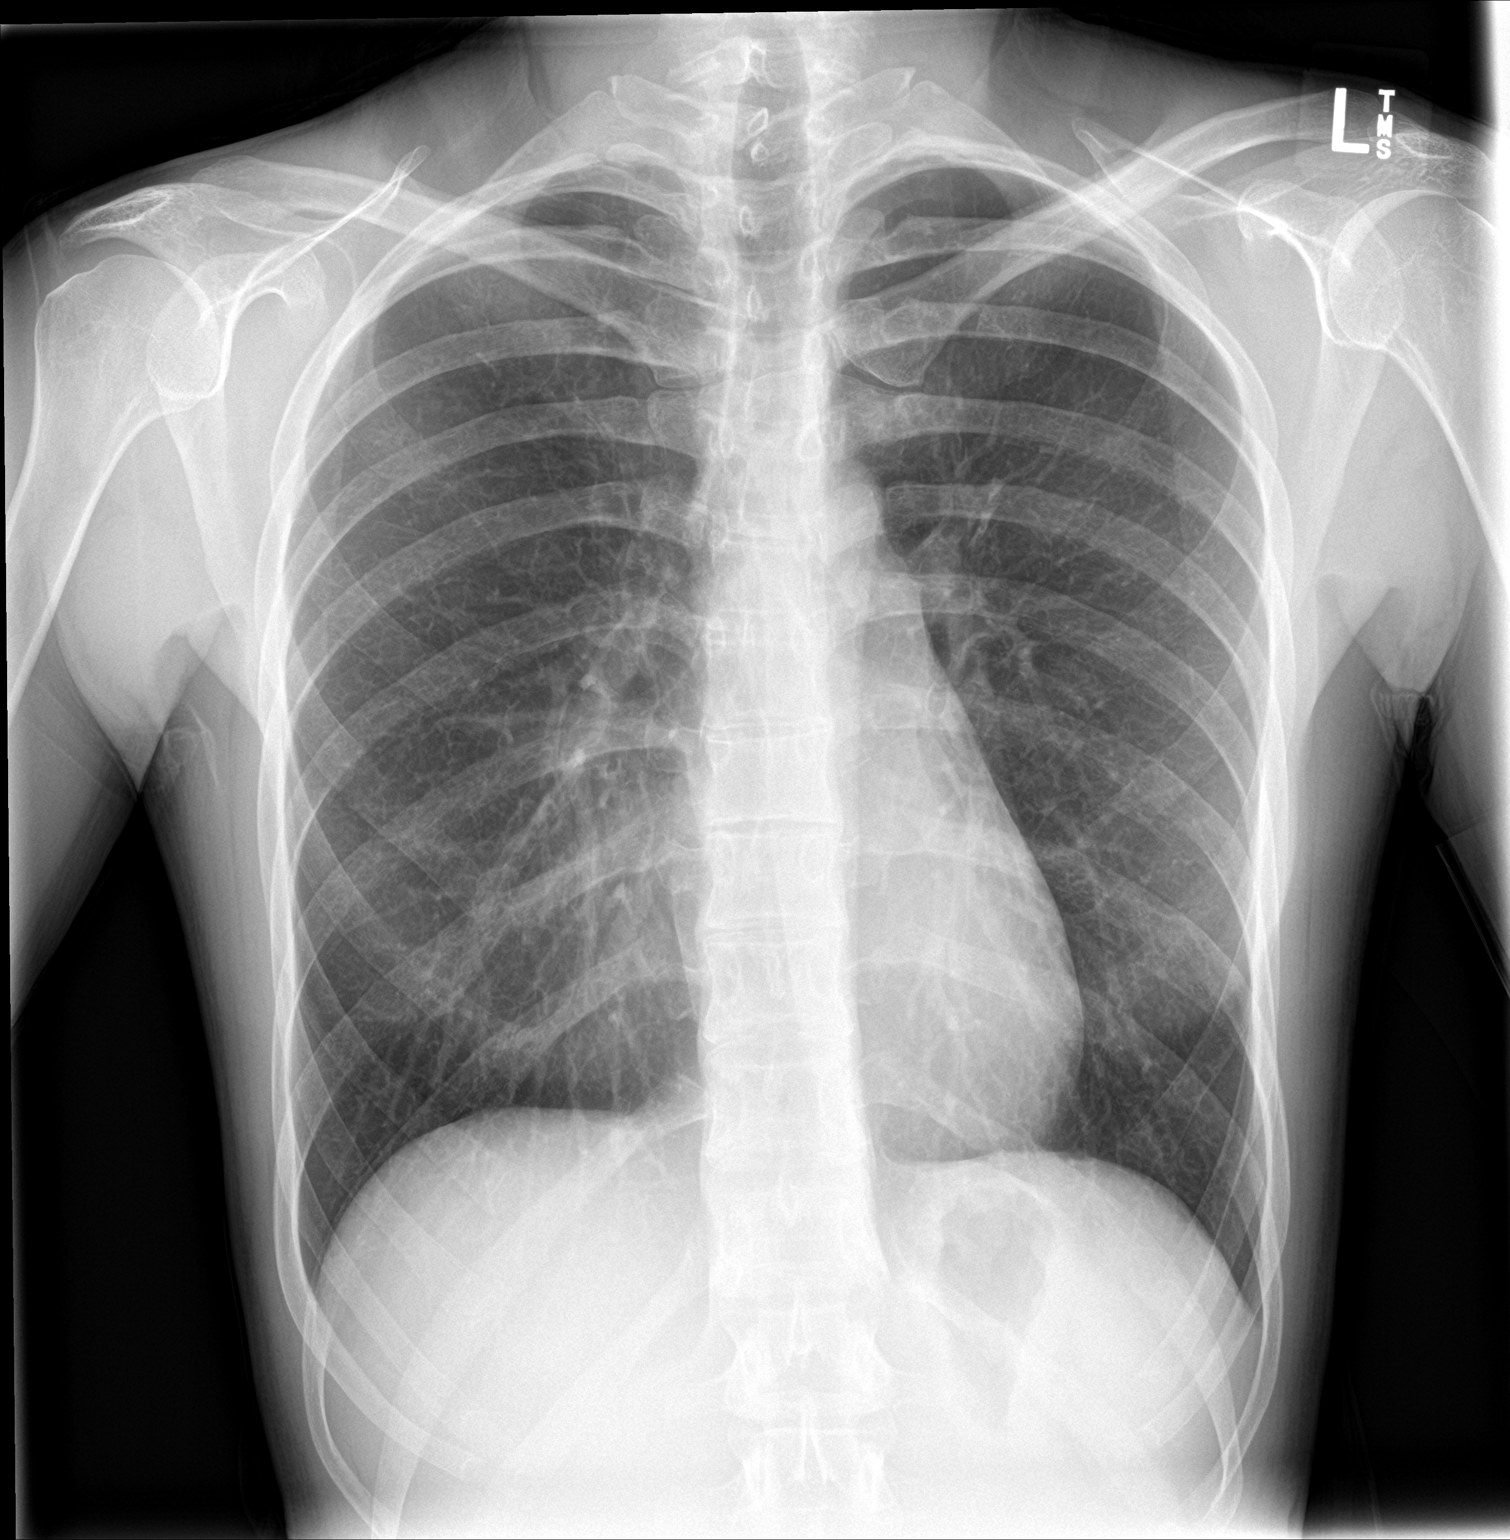

[chest lat]
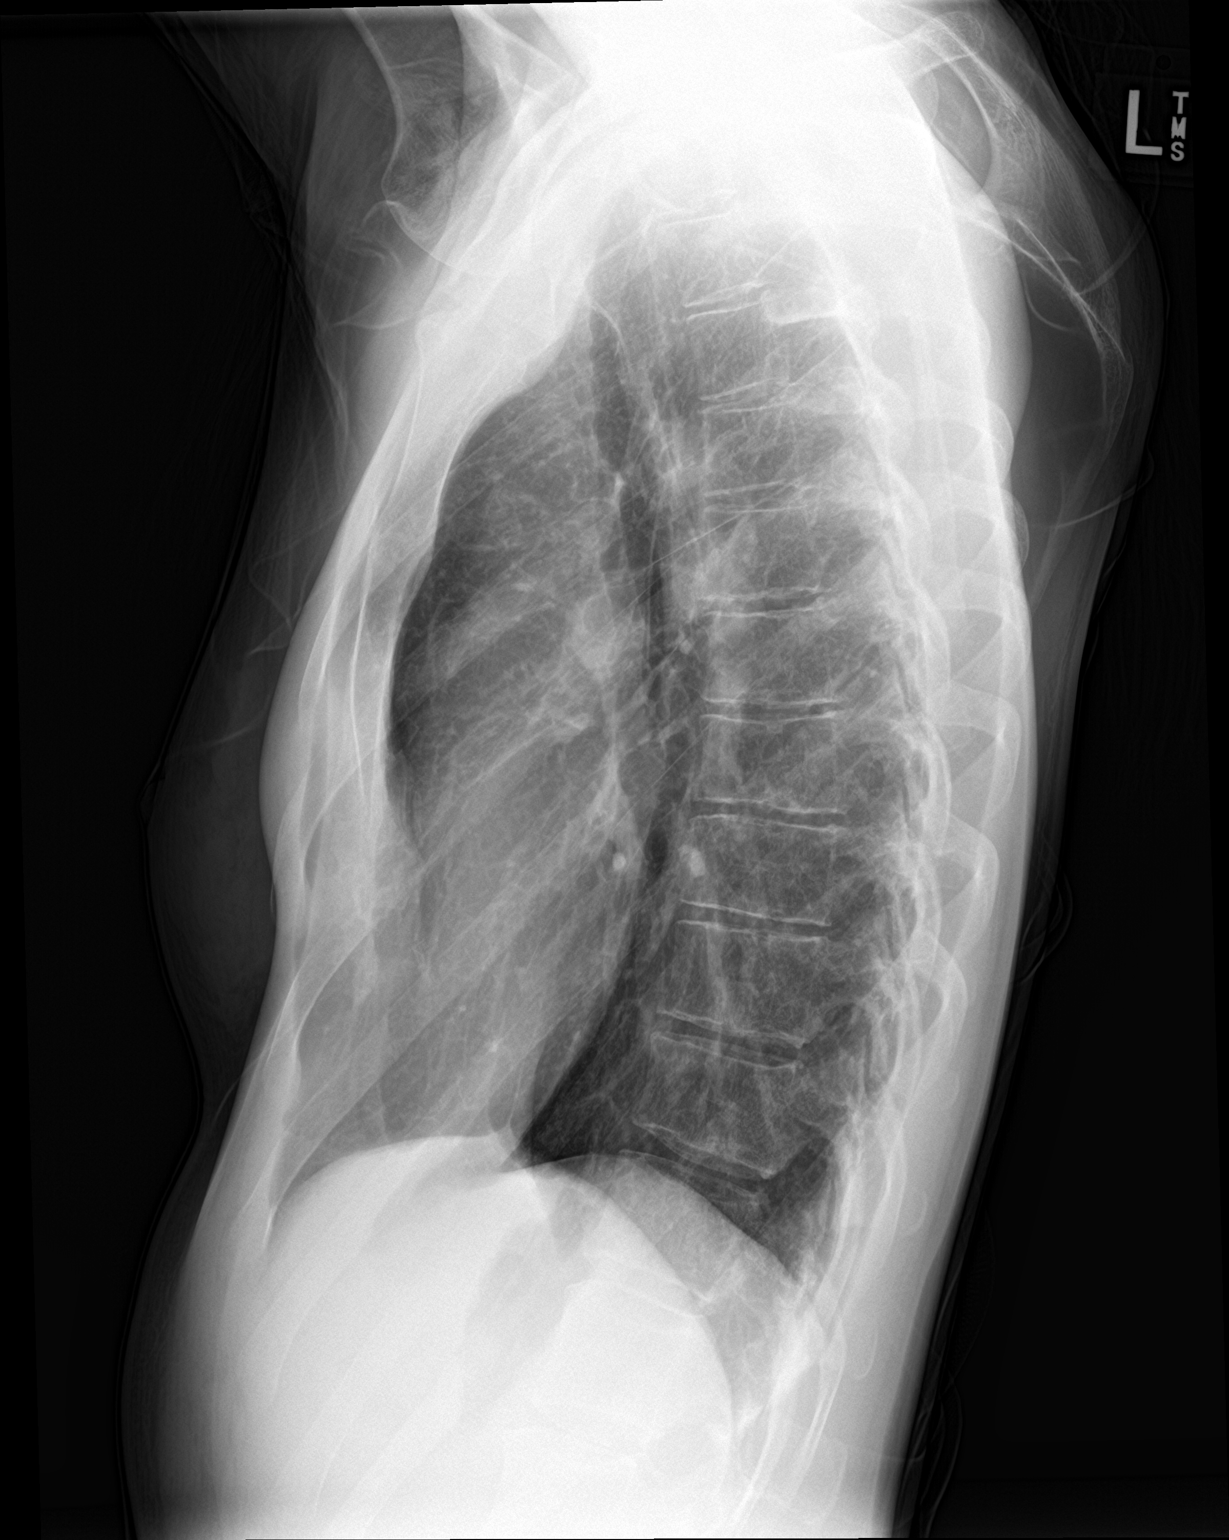

[2 of 2 positions shown; findings below may reference images not displayed]

FINDINGS: The heart size and mediastinal contours are within normal limits.
Both lungs are clear. The visualized skeletal structures are
unremarkable except for similar pectus excavatum deformity.
IMPRESSION: No active cardiopulmonary disease.
